# Patient Record
Sex: Female | Born: 1990 | Race: Black or African American | Hispanic: No | Marital: Single | Smoking: Former smoker
Health system: Southern US, Community
[De-identification: ages and names within clinical notes are randomized; demographics above are authoritative.]

## PROBLEM LIST (undated history)

## (undated) HISTORY — PX: CHOLECYSTECTOMY: SHX55

---

## 2014-08-12 ENCOUNTER — Encounter (HOSPITAL_BASED_OUTPATIENT_CLINIC_OR_DEPARTMENT_OTHER): Payer: Self-pay | Admitting: Emergency Medicine

## 2014-08-12 ENCOUNTER — Emergency Department (HOSPITAL_BASED_OUTPATIENT_CLINIC_OR_DEPARTMENT_OTHER)
Admission: EM | Admit: 2014-08-12 | Discharge: 2014-08-12 | Disposition: A | Discharge status: Home / Self Care | Payer: Medicaid Other | Attending: Emergency Medicine | Admitting: Emergency Medicine

## 2014-08-12 DIAGNOSIS — A5901 Trichomonal vulvovaginitis: Secondary | ICD-10-CM

## 2014-08-12 DIAGNOSIS — Z3A00 Weeks of gestation of pregnancy not specified: Secondary | ICD-10-CM | POA: Diagnosis not present

## 2014-08-12 DIAGNOSIS — O98319 Other infections with a predominantly sexual mode of transmission complicating pregnancy, unspecified trimester: Secondary | ICD-10-CM | POA: Diagnosis not present

## 2014-08-12 DIAGNOSIS — N390 Urinary tract infection, site not specified: Secondary | ICD-10-CM

## 2014-08-12 DIAGNOSIS — O2340 Unspecified infection of urinary tract in pregnancy, unspecified trimester: Secondary | ICD-10-CM | POA: Diagnosis not present

## 2014-08-12 DIAGNOSIS — O210 Mild hyperemesis gravidarum: Secondary | ICD-10-CM | POA: Insufficient documentation

## 2014-08-12 DIAGNOSIS — Z3490 Encounter for supervision of normal pregnancy, unspecified, unspecified trimester: Secondary | ICD-10-CM

## 2014-08-12 LAB — WET PREP, GENITAL: YEAST WET PREP: NONE SEEN

## 2014-08-12 LAB — URINALYSIS, ROUTINE W REFLEX MICROSCOPIC
BILIRUBIN URINE: NEGATIVE
Glucose, UA: NEGATIVE mg/dL
Hgb urine dipstick: NEGATIVE
KETONES UR: NEGATIVE mg/dL
NITRITE: NEGATIVE
PROTEIN: NEGATIVE mg/dL
Specific Gravity, Urine: 1.015 (ref 1.005–1.030)
UROBILINOGEN UA: 1 mg/dL (ref 0.0–1.0)
pH: 6 (ref 5.0–8.0)

## 2014-08-12 LAB — URINE MICROSCOPIC-ADD ON

## 2014-08-12 LAB — PREGNANCY, URINE: PREG TEST UR: POSITIVE — AB

## 2014-08-12 MED ORDER — CEPHALEXIN 500 MG PO CAPS: 500.0000 mg | ORAL_CAPSULE | Freq: Four times a day (QID) | ORAL | Status: DC

## 2014-08-12 MED ORDER — CEPHALEXIN 250 MG PO CAPS
500.0000 mg | ORAL_CAPSULE | Freq: Once | ORAL | Status: AC
  Administered 2014-08-12: 500 mg via ORAL
  Filled 2014-08-12: qty 2

## 2014-08-12 MED ORDER — CEFTRIAXONE SODIUM 1 G IJ SOLR: 1.0000 g | Freq: Once | INTRAMUSCULAR | Status: DC

## 2014-08-12 MED ORDER — METRONIDAZOLE 500 MG PO TABS: 500.0000 mg | ORAL_TABLET | Freq: Two times a day (BID) | ORAL | Status: DC

## 2014-08-12 MED ORDER — ONDANSETRON 4 MG PO TBDP
4.0000 mg | ORAL_TABLET | Freq: Once | ORAL | Status: AC
  Administered 2014-08-12: 4 mg via ORAL
  Filled 2014-08-12: qty 1

## 2014-08-12 NOTE — ED Notes (Signed)
Pt states she woke up this am and vomited blood.  Pt was diaphoresis.  Pt continues to have nausea.

## 2014-08-12 NOTE — ED Provider Notes (Signed)
CSN: [id number]     Arrival date & time 08/12/14  [id number] History   First MD Initiated Contact with Patient 08/12/14 0754     Chief Complaint  Patient presents with  . Emesis     (Consider location/radiation/quality/duration/timing/severity/associated sxs/prior Treatment) HPI Comments: 23 year old female, history of cholecystectomy 4 years ago who presents with a single episode of emesis this morning. She states that last night was a normal night, no nausea vomiting diarrhea abdominal pain fevers chills coughing or shortness of breath. This morning she woke up nauseated, vomited one time, states that her emesis contained blood, still has mild nausea but no pain. She denies any dysuria, denies being pregnant, denies any other chronic medications either over-the-counter or prescription. This is a new symptom, she does not have frequent nausea, she denies any pain at this time. Has had no medications prior to arrival. She does note being diaphoretic when she was vomiting.  Patient is a 23 y.o. female presenting with vomiting. The history is provided by the patient.  Emesis   No past medical history on file. Past Surgical History  Procedure Laterality Date  . Cholecystectomy     No family history on file. History  Substance Use Topics  . Smoking status: Never Smoker   . Smokeless tobacco: Not on file  . Alcohol Use: Not on file   OB History    No data available     Review of Systems  Gastrointestinal: Positive for vomiting.  All other systems reviewed and are negative.     Allergies  Depo-medrol  Home Medications   Prior to Admission medications   Medication Sig Start Date End Date Taking? Authorizing Provider  cephALEXin (KEFLEX) 500 MG capsule Take 1 capsule (500 mg total) by mouth 4 (four) times daily. 08/12/14   Vida RollerBrian D Vyom Brass, MD  metroNIDAZOLE (FLAGYL) 500 MG tablet Take 1 tablet (500 mg total) by mouth 2 (two) times daily. 08/12/14   Vida RollerBrian D Sander Remedios, MD   BP 109/61 mmHg   Pulse 70  Temp(Src) 98.4 F (36.9 C) (Oral)  Resp 16  Ht 5' (1.524 m)  Wt 120 lb (54.432 kg)  BMI 23.44 kg/m2  SpO2 97%  LMP 07/20/2014 Physical Exam  Constitutional: She appears well-developed and well-nourished. No distress.  HENT:  Head: Normocephalic and atraumatic.  Mouth/Throat: Oropharynx is clear and moist. No oropharyngeal exudate.  Oropharynx is clear and moist, no bleeding, no signs of nosebleed, no pharyngeal irritation, moist mucous membranes.  Eyes: Conjunctivae and EOM are normal. Pupils are equal, round, and reactive to light. Right eye exhibits no discharge. Left eye exhibits no discharge. No scleral icterus.  Neck: Normal range of motion. Neck supple. No JVD present. No thyromegaly present.  Cardiovascular: Normal rate, regular rhythm, normal heart sounds and intact distal pulses.  Exam reveals no gallop and no friction rub.   No murmur heard. Pulmonary/Chest: Effort normal and breath sounds normal. No respiratory distress. She has no wheezes. She has no rales.  Abdominal: Soft. Bowel sounds are normal. She exhibits no distension and no mass. There is no tenderness.  The abdomen is very soft and nontender, there is no guarding, no peritoneal signs, no epigastric tenderness, normal bowel sounds  Musculoskeletal: Normal range of motion. She exhibits no edema or tenderness.  Lymphadenopathy:    She has no cervical adenopathy.  Neurological: She is alert. Coordination normal.  Skin: Skin is warm and dry. No rash noted. No erythema.  Psychiatric: She has a normal mood and affect.  Her behavior is normal.  Nursing note and vitals reviewed.   ED Course  Procedures (including critical care time) Labs Review Labs Reviewed  WET PREP, GENITAL - Abnormal; Notable for the following:    Trich, Wet Prep MODERATE (*)    Clue Cells Wet Prep HPF POC MANY (*)    WBC, Wet Prep HPF POC FEW (*)    All other components within normal limits  PREGNANCY, URINE - Abnormal; Notable for  the following:    Preg Test, Ur POSITIVE (*)    All other components within normal limits  URINALYSIS, ROUTINE W REFLEX MICROSCOPIC - Abnormal; Notable for the following:    APPearance CLOUDY (*)    Leukocytes, UA MODERATE (*)    All other components within normal limits  URINE MICROSCOPIC-ADD ON - Abnormal; Notable for the following:    Squamous Epithelial / LPF MANY (*)    Bacteria, UA MANY (*)    All other components within normal limits  GC/CHLAMYDIA PROBE AMP  URINE CULTURE    Imaging Review No results found.    MDM   Final diagnoses:  UTI (lower urinary tract infection)  Pregnancy  Trichomonal vaginitis    The patient is well-appearing with normal vital signs and no evidence of ongoing vomiting or bleeding. Check urine, urine pregnancy, Zofran. Without any abdominal tenderness I would doubt pancreatitis, she has no infectious symptoms, patient in agreement with the plan.  The patient is a positive pregnancy test, Urinalysis reveals urinary tract infection with many bacteria, 11-20 white blood cells. The urinalysis also shows Trichomonas. Chaperoned pelvic exam shows moderate amount of white to yellow discharge, no cervical motion tenderness or adnexal tenderness or masses, no bleeding. Patient informed of results, we'll need to follow-up with OB/GYN, antibiotic started.  Trichomonas confirmed on wet prep, patient informed of follow-up STD testing, she will be given a phone call if positive for gonorrhea or chlamydia, she has been informed that she needs to tell her sexual partners, she is in agreement.  Meds given in ED:  Medications  ondansetron (ZOFRAN-ODT) disintegrating tablet 4 mg (4 mg Oral Given 08/12/14 0851)  cephALEXin (KEFLEX) capsule 500 mg (500 mg Oral Given 08/12/14 0850)    New Prescriptions   CEPHALEXIN (KEFLEX) 500 MG CAPSULE    Take 1 capsule (500 mg total) by mouth 4 (four) times daily.   METRONIDAZOLE (FLAGYL) 500 MG TABLET    Take 1 tablet (500 mg  total) by mouth 2 (two) times daily.       Vida RollerBrian D Naraly Fritcher, MD 08/12/14 [phone]

## 2014-08-12 NOTE — Discharge Instructions (Signed)
Please call your doctor for a followup appointment within 24-48 hours. When you talk to your doctor please let them know that you were seen in the emergency department and have them acquire all of your records so that they can discuss the findings with you and formulate a treatment plan to fully care for your new and ongoing problems. ° °

## 2014-08-13 LAB — URINE CULTURE: Colony Count: 50000

## 2014-08-13 LAB — GC/CHLAMYDIA PROBE AMP
CT PROBE, AMP APTIMA: NEGATIVE
GC PROBE AMP APTIMA: NEGATIVE

## 2015-04-11 ENCOUNTER — Encounter (HOSPITAL_COMMUNITY): Payer: Self-pay

## 2015-04-11 ENCOUNTER — Emergency Department (HOSPITAL_COMMUNITY)
Admission: EM | Admit: 2015-04-11 | Discharge: 2015-04-12 | Discharge status: Against Medical Advice | Payer: Medicaid Other | Attending: Emergency Medicine | Admitting: Emergency Medicine

## 2015-04-11 DIAGNOSIS — R110 Nausea: Secondary | ICD-10-CM | POA: Diagnosis not present

## 2015-04-11 DIAGNOSIS — R197 Diarrhea, unspecified: Secondary | ICD-10-CM | POA: Insufficient documentation

## 2015-04-11 NOTE — ED Provider Notes (Signed)
Patient not in room when I went to go see her. Apparently left after triage prior to being bedded. I did not examine this patient.   Pricilla Loveless, MD 04/11/15 2240

## 2015-04-11 NOTE — ED Notes (Signed)
Pt called for room placement multiple time with no answer

## 2015-04-11 NOTE — ED Notes (Signed)
Pt reports onset 2 days ago diarrhea and nausea.  Loose stools x 4 today.

## 2015-04-12 ENCOUNTER — Encounter (HOSPITAL_BASED_OUTPATIENT_CLINIC_OR_DEPARTMENT_OTHER): Payer: Self-pay | Admitting: Emergency Medicine

## 2017-11-02 ENCOUNTER — Other Ambulatory Visit: Payer: Self-pay

## 2017-11-02 ENCOUNTER — Encounter (HOSPITAL_COMMUNITY): Payer: Self-pay | Admitting: *Deleted

## 2017-11-02 ENCOUNTER — Emergency Department (HOSPITAL_COMMUNITY)
Admission: EM | Admit: 2017-11-02 | Discharge: 2017-11-02 | Disposition: A | Discharge status: Home / Self Care | Payer: Medicaid Other | Attending: Emergency Medicine | Admitting: Emergency Medicine

## 2017-11-02 DIAGNOSIS — R197 Diarrhea, unspecified: Secondary | ICD-10-CM

## 2017-11-02 DIAGNOSIS — O219 Vomiting of pregnancy, unspecified: Secondary | ICD-10-CM | POA: Diagnosis present

## 2017-11-02 DIAGNOSIS — Z3A00 Weeks of gestation of pregnancy not specified: Secondary | ICD-10-CM | POA: Diagnosis not present

## 2017-11-02 DIAGNOSIS — Z87891 Personal history of nicotine dependence: Secondary | ICD-10-CM | POA: Diagnosis not present

## 2017-11-02 DIAGNOSIS — R112 Nausea with vomiting, unspecified: Secondary | ICD-10-CM

## 2017-11-02 DIAGNOSIS — Z3A01 Less than 8 weeks gestation of pregnancy: Secondary | ICD-10-CM

## 2017-11-02 DIAGNOSIS — R531 Weakness: Secondary | ICD-10-CM

## 2017-11-02 LAB — CBC
HCT: 33.8 % — ABNORMAL LOW (ref 36.0–46.0)
Hemoglobin: 12.2 g/dL (ref 12.0–15.0)
MCH: 32 pg (ref 26.0–34.0)
MCHC: 36.1 g/dL — AB (ref 30.0–36.0)
MCV: 88.7 fL (ref 78.0–100.0)
Platelets: 267 10*3/uL (ref 150–400)
RBC: 3.81 MIL/uL — ABNORMAL LOW (ref 3.87–5.11)
RDW: 11.8 % (ref 11.5–15.5)
WBC: 8.5 10*3/uL (ref 4.0–10.5)

## 2017-11-02 LAB — COMPREHENSIVE METABOLIC PANEL
ALBUMIN: 4 g/dL (ref 3.5–5.0)
ALT: 20 U/L (ref 14–54)
AST: 20 U/L (ref 15–41)
Alkaline Phosphatase: 53 U/L (ref 38–126)
Anion gap: 10 (ref 5–15)
BILIRUBIN TOTAL: 1.4 mg/dL — AB (ref 0.3–1.2)
CALCIUM: 9.2 mg/dL (ref 8.9–10.3)
CO2: 23 mmol/L (ref 22–32)
Chloride: 102 mmol/L (ref 101–111)
Creatinine, Ser: 0.49 mg/dL (ref 0.44–1.00)
GFR calc Af Amer: >60 mL/min (ref >60)
GFR calc non Af Amer: >60 mL/min (ref >60)
GLUCOSE: 99 mg/dL (ref 65–99)
Potassium: 3.1 mmol/L — ABNORMAL LOW (ref 3.5–5.1)
Sodium: 135 mmol/L (ref 135–145)
TOTAL PROTEIN: 7 g/dL (ref 6.5–8.1)

## 2017-11-02 LAB — I-STAT BETA HCG BLOOD, ED (MC, WL, AP ONLY): I-stat hCG, quantitative: >2000 m[IU]/mL — ABNORMAL HIGH (ref <5)

## 2017-11-02 MED ORDER — PRENATAL VITAMIN 27-0.8 MG PO TABS: 1.0000 | ORAL_TABLET | Freq: Every day | ORAL | 0 refills | Status: AC

## 2017-11-02 MED ORDER — PYRIDOXINE HCL 25 MG PO TABS: 25.0000 mg | ORAL_TABLET | Freq: Four times a day (QID) | ORAL | 0 refills | Status: AC | PRN

## 2017-11-02 MED ORDER — ONDANSETRON HCL 4 MG/2ML IJ SOLN
4.0000 mg | Freq: Once | INTRAMUSCULAR | Status: AC
  Administered 2017-11-02: 4 mg via INTRAVENOUS
  Filled 2017-11-02: qty 2

## 2017-11-02 MED ORDER — POTASSIUM CHLORIDE CRYS ER 20 MEQ PO TBCR
40.0000 meq | EXTENDED_RELEASE_TABLET | Freq: Once | ORAL | Status: AC
  Administered 2017-11-02: 40 meq via ORAL
  Filled 2017-11-02: qty 2

## 2017-11-02 MED ORDER — SODIUM CHLORIDE 0.9 % IV BOLUS (SEPSIS)
1000.0000 mL | Freq: Once | INTRAVENOUS | Status: AC
  Administered 2017-11-02: 1000 mL via INTRAVENOUS

## 2017-11-02 MED ORDER — PYRIDOXINE HCL 100 MG/ML IJ SOLN
100.0000 mg | Freq: Once | INTRAMUSCULAR | Status: AC
  Administered 2017-11-02: 100 mg via INTRAVENOUS
  Filled 2017-11-02: qty 1

## 2017-11-02 NOTE — Discharge Instructions (Signed)
Your evaluation today is reassuring, nausea and vomiting likely related to new pregnancy, please take vitamin B6 up to 4 times a day as needed for nausea and vomiting, try and eat small frequent meals throughout the day and stay well-hydrated with water.  Please begin taking daily prenatal vitamin.  Please follow-up with the health department for continued OB/GYN care.  If you develop abdominal pain, vaginal bleeding or discharge, worsening nausea vomiting and you are unable to keep down fluids or other new or concerning symptoms please return to the ED for reevaluation.

## 2017-11-02 NOTE — ED Provider Notes (Signed)
[facility] Provider Note   CSN: [id number] Arrival date & time: 11/02/17  0754     History   Chief Complaint Chief Complaint  Patient presents with  . Weakness  . Emesis    HPI  Maria Hebert is a 27 y.o. Female 3 of prior cholecystectomy, otherwise healthy, presents to the ED for evaluation of nausea and vomiting with a few episodes of diarrhea, as well as feeling generally weak and fatigued for the past 4-5 days.  Patient reports since onset symptoms have been constant and not improving, has not tried any medications to treat her symptoms, has had no appetite denies any other aggravating or alleviating factors.  Reports some associated mild epigastric pain no lower abdominal pain, no hematemesis, hematochezia or melena.  Patient denies any fevers or chills, dysuria, frequency, hematuria or flank pain, no vaginal discharge, vaginal bleeding or pelvic pain.  Patient reports she may be pregnant last menstrual period was 1/19, and her periods in general have been very irregular.  She reports one prior pregnancy 6 years ago and she did not determine she was pregnant until she was 5 months along.  Patient denies any chest pain, shortness of breath, cough or upper respiratory symptoms, no dizziness or lightheadedness.      History reviewed. No pertinent past medical history.  There are no active problems to display for this patient.   Past Surgical History:  Procedure Laterality Date  . CHOLECYSTECTOMY      OB History    Gravida Para Term Preterm AB Living   0 0 0 0 0     SAB TAB Ectopic Multiple Live Births   0 0 0           Home Medications    Prior to Admission medications   Medication Sig Start Date End Date Taking? Authorizing Provider  cephALEXin (KEFLEX) 500 MG capsule Take 1 capsule (500 mg total) by mouth 4 (four) times daily. 08/12/14   Eber HongMiller, Brian, MD  metroNIDAZOLE (FLAGYL) 500 MG tablet Take 1 tablet (500 mg total)  by mouth 2 (two) times daily. 08/12/14   Eber HongMiller, Brian, MD    Family History History reviewed. No pertinent family history.  Social History Social History   Tobacco Use  . Smoking status: Former Smoker  Substance Use Topics  . Alcohol use: Yes    Comment: occ wine   . Drug use: No     Allergies   Depo-medrol [methylprednisolone] and Depo-provera [medroxyprogesterone acetate]   Review of Systems Review of Systems  Constitutional: Positive for fatigue. Negative for chills and fever.  HENT: Negative for congestion and sore throat.   Eyes: Negative for visual disturbance.  Respiratory: Negative for cough, chest tightness and shortness of breath.   Cardiovascular: Negative for chest pain and leg swelling.  Gastrointestinal: Positive for abdominal pain, diarrhea, nausea and vomiting. Negative for blood in stool and constipation.  Genitourinary: Negative for dysuria, flank pain, frequency, hematuria, urgency, vaginal bleeding, vaginal discharge and vaginal pain.  Musculoskeletal: Negative for arthralgias and myalgias.  Skin: Negative for color change, pallor and rash.  Neurological: Positive for weakness. Negative for dizziness, light-headedness and headaches.     Physical Exam Updated Vital Signs BP 117/67 (BP Location: Right Arm)   Pulse 61   Temp 98.6 F (37 C) (Oral)   Resp 12   LMP 09/22/2017   SpO2 100%   Physical Exam  Constitutional: She is oriented to person, place, and time. She  appears well-developed and well-nourished. No distress.  HENT:  Head: Normocephalic and atraumatic.  Mouth/Throat: Oropharynx is clear and moist.  Eyes: Right eye exhibits no discharge. Left eye exhibits no discharge.  Neck: Neck supple.  Cardiovascular: Normal rate, regular rhythm, normal heart sounds and intact distal pulses.  Pulmonary/Chest: Effort normal and breath sounds normal. No stridor. No respiratory distress. She has no wheezes. She has no rales.  Respirations equal and  unlabored, patient able to speak in full sentences, lungs clear to auscultation bilaterally  Abdominal: Soft. Bowel sounds are normal. She exhibits no distension and no mass. There is no tenderness. There is no guarding.  Abdomen flat, soft, very minimal epigastric tenderness without guarding, all other quadrants nontender to palpation specifically there is no lower abdominal tenderness over the pelvis, no CVA tenderness, no peritoneal signs  Musculoskeletal: She exhibits no edema or deformity.  Neurological: She is alert and oriented to person, place, and time. Coordination normal.  Skin: Skin is warm and dry. Capillary refill takes less than 2 seconds. She is not diaphoretic.  Psychiatric: She has a normal mood and affect. Her behavior is normal.  Nursing note and vitals reviewed.    ED Treatments / Results  Labs (all labs ordered are listed, but only abnormal results are displayed) Labs Reviewed  COMPREHENSIVE METABOLIC PANEL - Abnormal; Notable for the following components:      Result Value   Potassium 3.1 (*)    BUN <5 (*)    Total Bilirubin 1.4 (*)    All other components within normal limits  CBC - Abnormal; Notable for the following components:   RBC 3.81 (*)    HCT 33.8 (*)    MCHC 36.1 (*)    All other components within normal limits  I-STAT BETA HCG BLOOD, ED ([facility], [facility], [facility] ONLY) - Abnormal; Notable for the following components:   I-stat hCG, quantitative >2,000.0 (*)    All other components within normal limits    EKG  EKG Interpretation None       Radiology No results found.  Procedures Procedures (including critical care time)  Medications Ordered in ED Medications  sodium chloride 0.9 % bolus 1,000 mL (0 mLs Intravenous Stopped 11/02/17 1242)  potassium chloride SA (K-DUR,KLOR-CON) CR tablet 40 mEq (40 mEq Oral Given 11/02/17 1120)  pyridOXINE (B-6) injection 100 mg (100 mg Intravenous Given 11/02/17 1241)  ondansetron (ZOFRAN) injection 4 mg (4 mg Intravenous  Given 11/02/17 1123)     Initial Impression / Assessment and Plan / ED Course  I have reviewed the triage vital signs and the nursing notes.  Pertinent labs & imaging results that were available during my care of the patient were reviewed by me and considered in my medical decision making (see chart for details).  Patient presents to the ED for evaluation of 4-5 days of weakness, fatigue, nausea and vomiting, reports she has not been able to get good rest.  On exam vitals are normal and patient is overall well-appearing.  I-STAT beta hCG done in triage is greater than 2000, patient reports last menstrual period was 1/19 but her periods are extremely irregular, this pregnancy was not planned, is not her first pregnancy, has a 80-year-old at home.  Abdominal exam is benign, given that patient is not complaining of any vaginal discharge, vaginal bleeding or pelvic discomfort, she prefers to defer pelvic exam today, she plans to follow-up with the health department.  Will give fluids, pyridoxine and Zofran.  On reevaluation patient reports  she is feeling much better has not had no further episodes of nausea vomiting or diarrhea and feels like she has more energy than she has had all week.  Abdomen remains benign.  At this time patient is stable for discharge home we will have her begin taking daily prenatal vitamin and vitamin B6 to help with nausea, discussed with patient will hold off on prescribing her Zofran at this time, patient to follow-up with the health department for continued OB care, strict return precautions regarding abdominal pain, vaginal bleeding or discharge, and worsening nausea vomiting resulting in dehydration discussed with the patient.  She expresses understanding and is in agreement with plan.  Final Clinical Impressions(s) / ED Diagnoses   Final diagnoses:  Nausea vomiting and diarrhea  Weakness  Less than [redacted] weeks gestation of pregnancy    ED Discharge Orders        Ordered     Prenatal Vit-Fe Fumarate-FA (PRENATAL VITAMIN) 27-0.8 MG TABS  Daily     11/02/17 1342    pyridOXINE (VITAMIN B-6) 25 MG tablet  Every 6 hours PRN     11/02/17 1342       Dartha Lodge, PA-C 11/02/17 1400    Bethann Berkshire, MD 11/02/17 1546

## 2017-11-02 NOTE — ED Triage Notes (Signed)
Pt reports fatigue, weakness, n/v and insomnia x 1 week. Pt states she is possibly pregnant. lmp 1/19.

## 2019-10-13 ENCOUNTER — Emergency Department (HOSPITAL_COMMUNITY): Payer: 59

## 2019-10-13 ENCOUNTER — Emergency Department (HOSPITAL_COMMUNITY)
Admission: EM | Admit: 2019-10-13 | Discharge: 2019-10-13 | Disposition: A | Discharge status: Home / Self Care | Payer: 59 | Attending: Emergency Medicine | Admitting: Emergency Medicine

## 2019-10-13 DIAGNOSIS — R109 Unspecified abdominal pain: Secondary | ICD-10-CM | POA: Diagnosis present

## 2019-10-13 DIAGNOSIS — Z87891 Personal history of nicotine dependence: Secondary | ICD-10-CM | POA: Insufficient documentation

## 2019-10-13 LAB — CBC WITH DIFFERENTIAL/PLATELET
Abs Immature Granulocytes: 0.02 10*3/uL (ref 0.00–0.07)
Basophils Absolute: 0 10*3/uL (ref 0.0–0.1)
Basophils Relative: 1 %
Eosinophils Absolute: 0.2 10*3/uL (ref 0.0–0.5)
Eosinophils Relative: 3 %
HCT: 35.6 % — ABNORMAL LOW (ref 36.0–46.0)
Hemoglobin: 12 g/dL (ref 12.0–15.0)
Immature Granulocytes: 0 %
Lymphocytes Relative: 26 %
Lymphs Abs: 1.9 10*3/uL (ref 0.7–4.0)
MCH: 30.8 pg (ref 26.0–34.0)
MCHC: 33.7 g/dL (ref 30.0–36.0)
MCV: 91.5 fL (ref 80.0–100.0)
Monocytes Absolute: 0.4 10*3/uL (ref 0.1–1.0)
Monocytes Relative: 6 %
Neutro Abs: 4.6 10*3/uL (ref 1.7–7.7)
Neutrophils Relative %: 64 %
Platelets: 257 10*3/uL (ref 150–400)
RBC: 3.89 MIL/uL (ref 3.87–5.11)
RDW: 11.8 % (ref 11.5–15.5)
WBC: 7.2 10*3/uL (ref 4.0–10.5)
nRBC: 0 % (ref 0.0–0.2)

## 2019-10-13 LAB — URINALYSIS, ROUTINE W REFLEX MICROSCOPIC
Bilirubin Urine: NEGATIVE
Glucose, UA: NEGATIVE mg/dL
Hgb urine dipstick: NEGATIVE
Ketones, ur: NEGATIVE mg/dL
Nitrite: NEGATIVE
Protein, ur: NEGATIVE mg/dL
Specific Gravity, Urine: 1.014 (ref 1.005–1.030)
pH: 6 (ref 5.0–8.0)

## 2019-10-13 LAB — COMPREHENSIVE METABOLIC PANEL
ALT: 14 U/L (ref 0–44)
AST: 16 U/L (ref 15–41)
Albumin: 4 g/dL (ref 3.5–5.0)
Alkaline Phosphatase: 69 U/L (ref 38–126)
Anion gap: 9 (ref 5–15)
BUN: 8 mg/dL (ref 6–20)
CO2: 25 mmol/L (ref 22–32)
Calcium: 9.3 mg/dL (ref 8.9–10.3)
Chloride: 104 mmol/L (ref 98–111)
Creatinine, Ser: 0.6 mg/dL (ref 0.44–1.00)
GFR calc Af Amer: >60 mL/min (ref >60)
GFR calc non Af Amer: >60 mL/min (ref >60)
Glucose, Bld: 96 mg/dL (ref 70–99)
Potassium: 3.7 mmol/L (ref 3.5–5.1)
Sodium: 138 mmol/L (ref 135–145)
Total Bilirubin: 0.6 mg/dL (ref 0.3–1.2)
Total Protein: 7 g/dL (ref 6.5–8.1)

## 2019-10-13 LAB — I-STAT BETA HCG BLOOD, ED (MC, WL, AP ONLY): I-stat hCG, quantitative: <5 m[IU]/mL (ref <5)

## 2019-10-13 LAB — LIPASE, BLOOD: Lipase: 26 U/L (ref 11–51)

## 2019-10-13 MED ORDER — KETOROLAC TROMETHAMINE 15 MG/ML IJ SOLN
15.0000 mg | Freq: Once | INTRAMUSCULAR | Status: AC
  Administered 2019-10-13: 15 mg via INTRAVENOUS
  Filled 2019-10-13: qty 1

## 2019-10-13 MED ORDER — IOHEXOL 300 MG/ML  SOLN
100.0000 mL | Freq: Once | INTRAMUSCULAR | Status: AC | PRN
  Administered 2019-10-13: 100 mL via INTRAVENOUS

## 2019-10-13 NOTE — ED Notes (Signed)
ED Provider at bedside. 

## 2019-10-13 NOTE — ED Triage Notes (Signed)
Pt endorses abd pain for 2 weeks. States the pain is on both sides.

## 2019-10-13 NOTE — ED Triage Notes (Signed)
PT is here with reports right mid-lower quad abdominal pain X 2wks. Initially started on her mid left side of her abdomin. She reports that her menstrual period started about th same time, she assumed it was premenstrual pain; but period her monthly period is completed and the pain persist. She reports using heating pads without results  Intermittent pain Gallbladder removed 10 years ago

## 2019-10-13 NOTE — Discharge Instructions (Addendum)
Please return for any problem.  Follow-up with your regular care provider as instructed. °

## 2019-10-13 NOTE — ED Notes (Signed)
The RN will get the labs for the Pt.

## 2019-10-13 NOTE — ED Provider Notes (Signed)
[facility] EMERGENCY DEPARTMENT Provider Note   CSN: [id number] Arrival date & time: 10/13/19  3151     History Chief Complaint  Patient presents with  . Abdominal Pain    Maria Hebert is a 29 y.o. female.  29 year old female with prior medical history as detailed below presents for evaluation of right sided abdominal pain.  Patient reports onset of pain approximately 2 weeks prior.  Pain was intermittent.  She reports that the pain began on the left side and over the last week has moved to the right side.  Patient does report that she is status post cholecystectomy 10 years prior.  She reports that she has not experienced nausea, vomiting, fever, bowel movement change, urinary symptoms, or other specific complaint.  She took ibuprofen at home for his symptoms with moderate improvement of her pain.    The history is provided by the patient and medical records.  Abdominal Pain Pain location:  RUQ and RLQ Pain quality: aching   Pain radiates to:  Does not radiate Pain severity:  Mild Onset quality:  Gradual Duration:  2 weeks Timing:  Constant Progression:  Waxing and waning Chronicity:  New Relieved by:  Nothing Worsened by:  Nothing Ineffective treatments:  NSAIDs      No past medical history on file.  There are no problems to display for this patient.   Past Surgical History:  Procedure Laterality Date  . CHOLECYSTECTOMY       OB History    Gravida  0   Para  0   Term  0   Preterm  0   AB  0   Living        SAB  0   TAB  0   Ectopic  0   Multiple      Live Births              No family history on file.  Social History   Tobacco Use  . Smoking status: Former Smoker  Substance Use Topics  . Alcohol use: Yes    Comment: occ wine   . Drug use: No    Home Medications Prior to Admission medications   Medication Sig Start Date End Date Taking? Authorizing Provider  ibuprofen (ADVIL) 200 MG tablet Take 200 mg  by mouth every 6 (six) hours as needed for moderate pain.    Yes [provider]  Prenatal Vit-Fe Fumarate-FA (PRENATAL VITAMIN) 27-0.8 MG TABS Take 1 tablet by mouth daily. Patient not taking: Reported on 10/13/2019 11/02/17   Dartha Lodge, PA-C  pyridOXINE (VITAMIN B-6) 25 MG tablet Take 1 tablet (25 mg total) by mouth every 6 (six) hours as needed. Patient not taking: Reported on 10/13/2019 11/02/17   Dartha Lodge, PA-C    Allergies    Depo-medrol [methylprednisolone] and Depo-provera [medroxyprogesterone acetate]  Review of Systems   Review of Systems  Gastrointestinal: Positive for abdominal pain.  All other systems reviewed and are negative.   Physical Exam Updated Vital Signs BP 112/60   Pulse 71   Temp 98.2 F (36.8 C) (Oral)   Resp 16   SpO2 99%   Physical Exam Vitals and nursing note reviewed.  Constitutional:      General: She is not in acute distress.    Appearance: She is well-developed.  HENT:     Head: Normocephalic and atraumatic.  Eyes:     Conjunctiva/sclera: Conjunctivae normal.     Pupils: Pupils are equal,  round, and reactive to light.  Cardiovascular:     Rate and Rhythm: Normal rate and regular rhythm.     Heart sounds: Normal heart sounds.  Pulmonary:     Effort: Pulmonary effort is normal. No respiratory distress.     Breath sounds: Normal breath sounds.  Abdominal:     General: There is no distension.     Palpations: Abdomen is soft.     Tenderness: There is abdominal tenderness.     Comments: Mild diffuse tenderness overlying the right abdomen.  No CVA tenderness   No lower abdominal tenderness with palpation.   Musculoskeletal:        General: No deformity. Normal range of motion.     Cervical back: Normal range of motion and neck supple.  Skin:    General: Skin is warm and dry.  Neurological:     Mental Status: She is alert and oriented to person, place, and time.     ED Results / Procedures / Treatments   Labs (all  labs ordered are listed, but only abnormal results are displayed) Labs Reviewed  CBC WITH DIFFERENTIAL/PLATELET - Abnormal; Notable for the following components:      Result Value   HCT 35.6 (*)    All other components within normal limits  URINALYSIS, ROUTINE W REFLEX MICROSCOPIC - Abnormal; Notable for the following components:   APPearance HAZY (*)    Leukocytes,Ua LARGE (*)    Bacteria, UA RARE (*)    All other components within normal limits  COMPREHENSIVE METABOLIC PANEL  LIPASE, BLOOD  I-STAT BETA HCG BLOOD, ED ([facility], [facility], [facility] ONLY)    EKG None  Radiology No results found.  Procedures Procedures (including critical care time)  Medications Ordered in ED Medications  ketorolac (TORADOL) 15 MG/ML injection 15 mg (15 mg Intravenous Given 10/13/19 1037)    ED Course  I have reviewed the triage vital signs and the nursing notes.  Pertinent labs & imaging results that were available during my care of the patient were reviewed by me and considered in my medical decision making (see chart for details).    MDM Rules/Calculators/A&P                      MDM  Screen complete  Maria Hebert was evaluated in Emergency Department on 10/13/2019 for the symptoms described in the history of present illness. She was evaluated in the context of the global COVID-19 pandemic, which necessitated consideration that the patient might be at risk for infection with the SARS-CoV-2 virus that causes COVID-19. Institutional protocols and algorithms that pertain to the evaluation of patients at risk for COVID-19 are in a state of rapid change based on information released by regulatory bodies including the CDC and federal and state organizations. These policies and algorithms were followed during the patient's care in the ED.  Patient is presenting for evaluation of abdominal pain.  Work-up and exam are not suggestive of significant acute pathology.  CT imaging was held up during the read -  verbally reported to me as being a normal appendix without other acute pathology.    Patient does feel improved following her ED evaluation.  She desires discharge.  She does understand need for close follow-up.  Strict return precautions given and understood.  Final Clinical Impression(s) / ED Diagnoses Final diagnoses:  Abdominal pain, unspecified abdominal location    Rx / DC Orders ED Discharge Orders    None  Valarie Merino, MD 10/13/19 1447

## 2019-10-13 NOTE — ED Notes (Signed)
Patient verbalizes understanding of discharge instructions. Opportunity for questioning and answers were provided. Armband removed by staff, pt discharged from ED.  

## 2020-08-04 IMAGING — CT CT ABD-PELV W/ CM
2 of 4 series · 16 of 46 positions shown, 18 images · IV contrast (omnipaque)
Comparison: None.

CLINICAL DATA: Right greater than left-sided abdominal pain for the
past 2 weeks.

EXAM:
CT ABDOMEN AND PELVIS WITH CONTRAST
TECHNIQUE: Multidetector CT imaging of the abdomen and pelvis was performed
using the standard protocol following bolus administration of
intravenous contrast.
CONTRAST:  100mL OMNIPAQUE IOHEXOL 300 MG/ML  SOLN

[Series 3: abdomen 5.0 · axial · 0.59mm/px · z∈[-360,+5]mm · 13 of 83 slices shown, 15 images]
[im 5/83  soft-tissue]
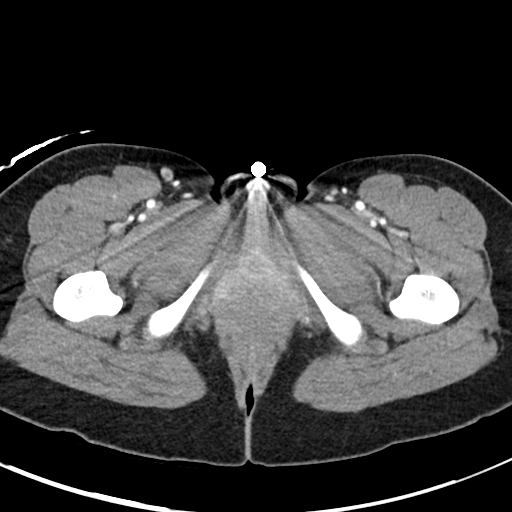
[im 5/83  bone]
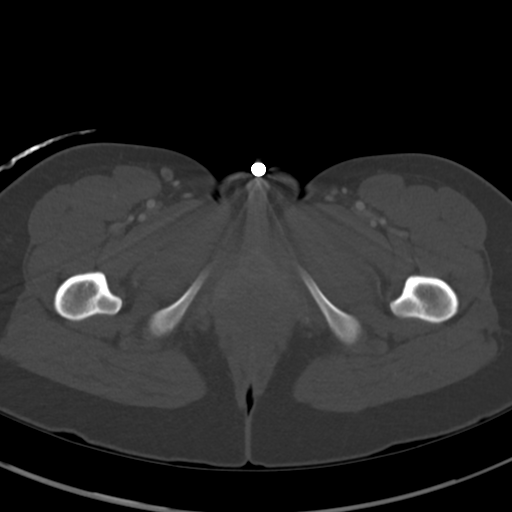
[im 10/83  soft-tissue]
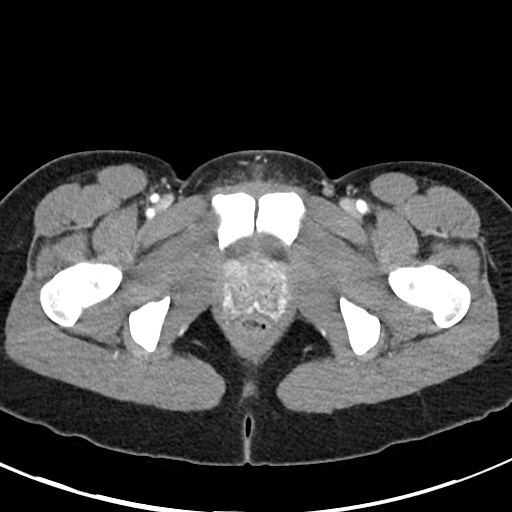
[im 19/83  soft-tissue]
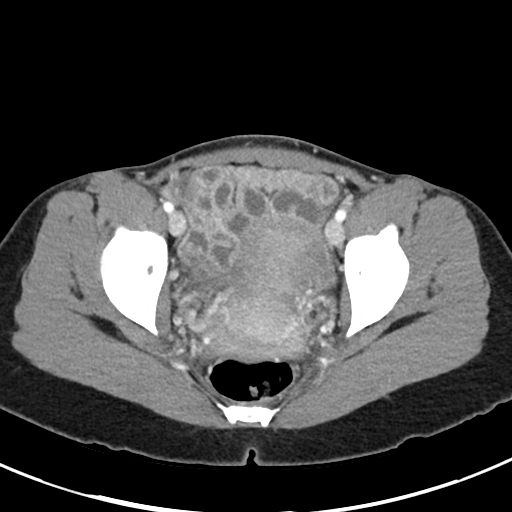
[im 23/83  soft-tissue]
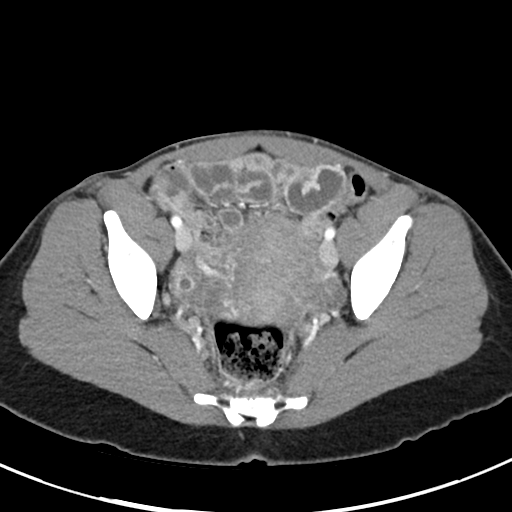
[im 28/83  soft-tissue]
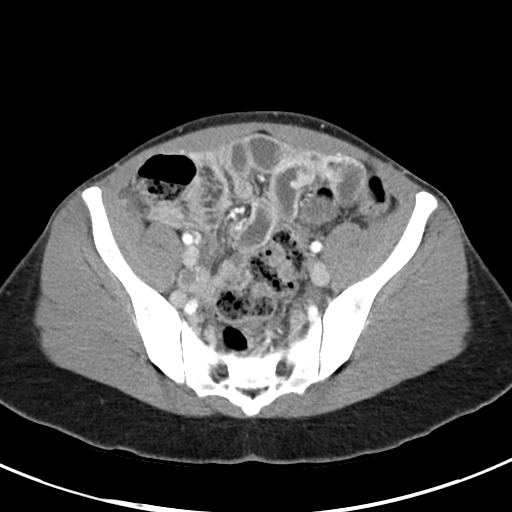
[im 37/83  soft-tissue]
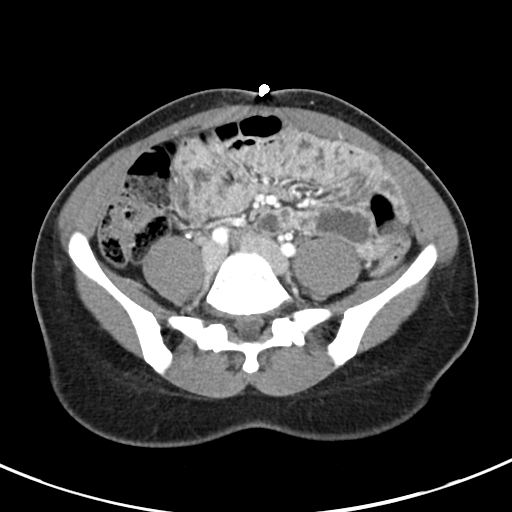
[im 42/83  soft-tissue]
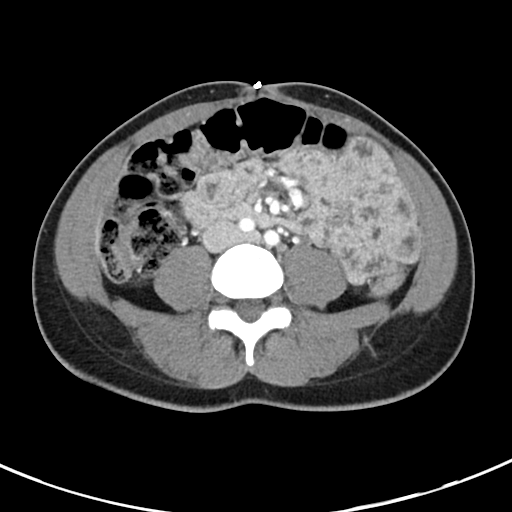
[im 46/83  soft-tissue]
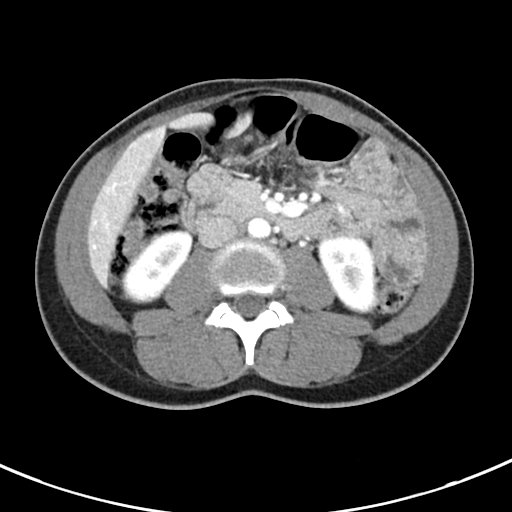
[im 55/83  soft-tissue]
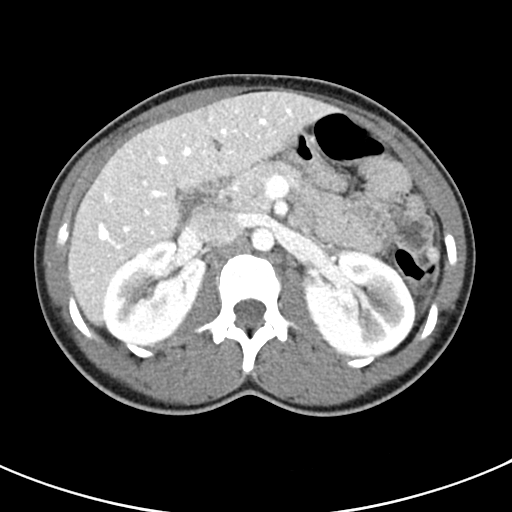
[im 55/83  bone]
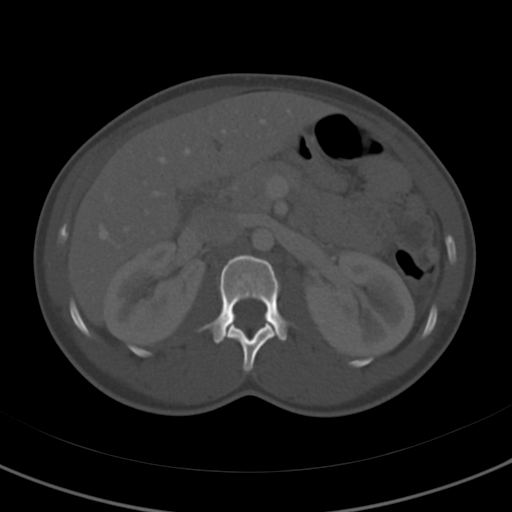
[im 60/83  soft-tissue]
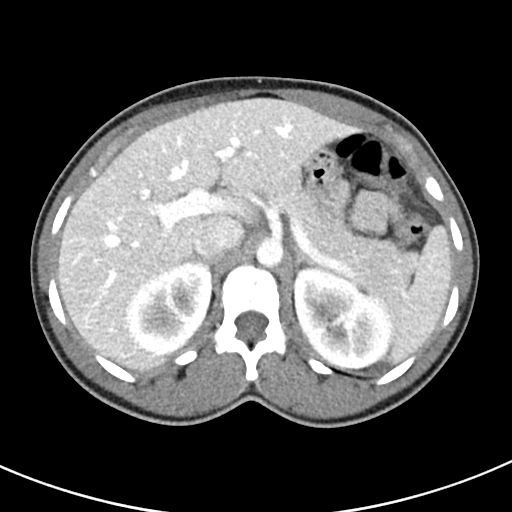
[im 64/83  soft-tissue]
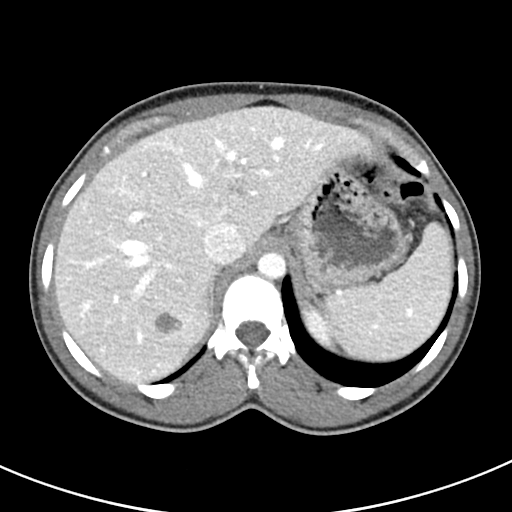
[im 73/83  soft-tissue]
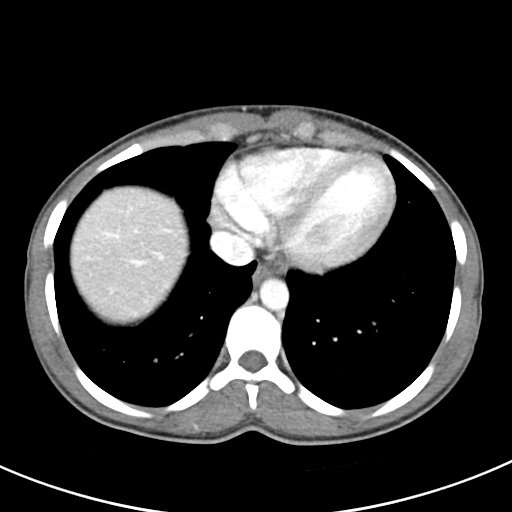
[im 78/83  soft-tissue]
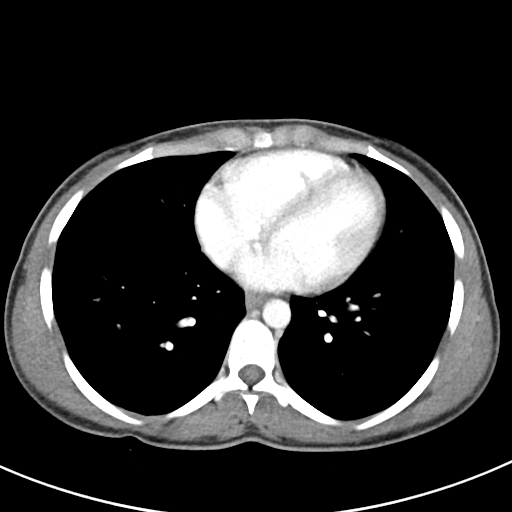

[Series 6: abdomen 3.0 mpr cor · coronal · 0.59mm/px · 3 of 81 slices shown]
[im 27/81  soft-tissue]
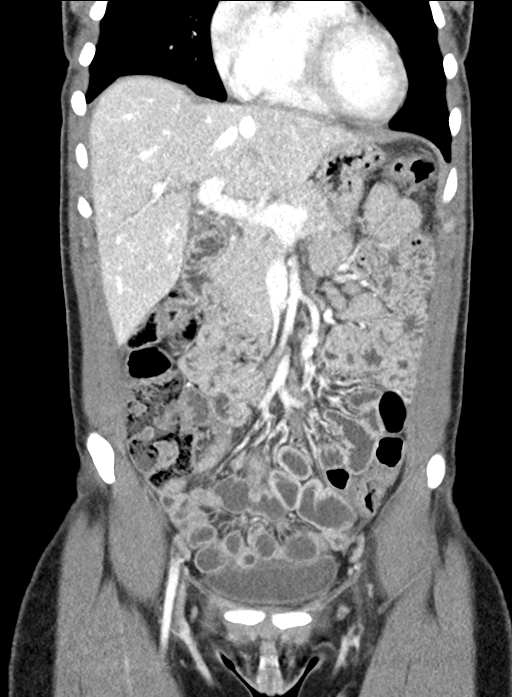
[im 36/81  soft-tissue]
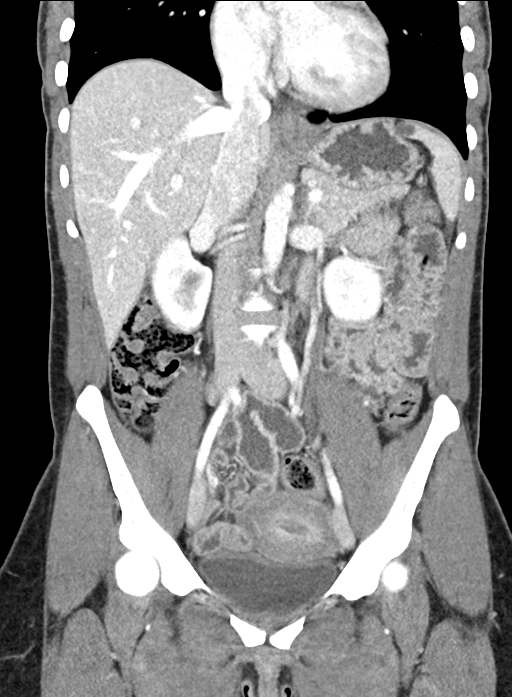
[im 45/81  soft-tissue]
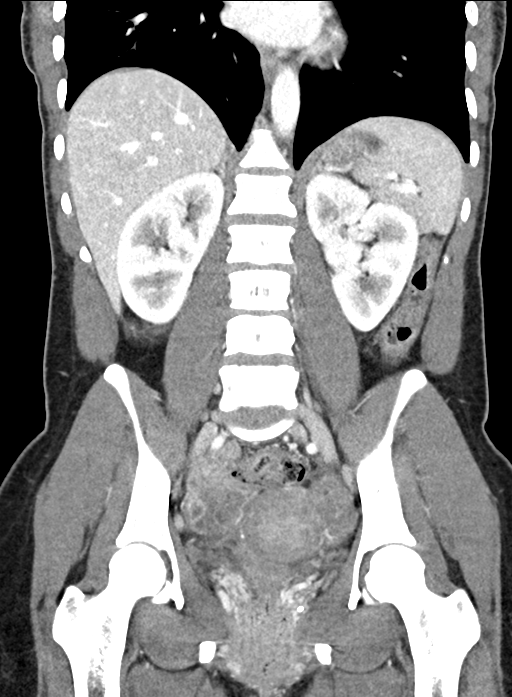

[16 of 46 positions shown; findings below may reference images not displayed]

FINDINGS: Lower chest: No acute abnormality.

Hepatobiliary: 1.5 cm lesion in the posterior right hepatic lobe
with subtle peripheral nodular enhancement, likely hemangioma.
Status post cholecystectomy. No biliary dilatation.

Pancreas: Unremarkable. No pancreatic ductal dilatation or
surrounding inflammatory changes.

Spleen: Normal in size without focal abnormality.

Adrenals/Urinary Tract: Adrenal glands are unremarkable. Kidneys are
normal, without renal calculi, focal lesion, or hydronephrosis.
Bladder is unremarkable.

Stomach/Bowel: Stomach is within normal limits. Appendix appears
normal. No evidence of bowel wall thickening, distention, or
inflammatory changes.

Vascular/Lymphatic: No significant vascular findings are present. No
enlarged abdominal or pelvic lymph nodes.

Reproductive: Uterus and bilateral adnexa are unremarkable. Right
corpus luteum.

Other: No abdominal wall hernia or abnormality. No abdominopelvic
ascites. No pneumoperitoneum.

Musculoskeletal: No acute or significant osseous findings.
IMPRESSION: 1. No acute intra-abdominal process.  Normal appendix.
2. 1.5 cm lesion in the posterior right hepatic lobe with subtle
peripheral nodular enhancement, likely a hemangioma.

## 2023-04-02 DIAGNOSIS — N898 Other specified noninflammatory disorders of vagina: Secondary | ICD-10-CM | POA: Diagnosis not present

## 2023-04-02 DIAGNOSIS — Z7251 High risk heterosexual behavior: Secondary | ICD-10-CM | POA: Diagnosis not present

## 2023-06-16 ENCOUNTER — Other Ambulatory Visit: Payer: Self-pay

## 2023-06-16 ENCOUNTER — Encounter (HOSPITAL_BASED_OUTPATIENT_CLINIC_OR_DEPARTMENT_OTHER): Payer: Self-pay

## 2023-06-16 ENCOUNTER — Emergency Department (HOSPITAL_BASED_OUTPATIENT_CLINIC_OR_DEPARTMENT_OTHER)
Admission: EM | Admit: 2023-06-16 | Discharge: 2023-06-16 | Disposition: A | Discharge status: Home / Self Care | Payer: Medicaid Other | Attending: Emergency Medicine | Admitting: Emergency Medicine

## 2023-06-16 ENCOUNTER — Emergency Department (HOSPITAL_BASED_OUTPATIENT_CLINIC_OR_DEPARTMENT_OTHER): Payer: Medicaid Other | Admitting: Radiology

## 2023-06-16 DIAGNOSIS — S3992XA Unspecified injury of lower back, initial encounter: Secondary | ICD-10-CM | POA: Diagnosis present

## 2023-06-16 DIAGNOSIS — S39012A Strain of muscle, fascia and tendon of lower back, initial encounter: Secondary | ICD-10-CM | POA: Insufficient documentation

## 2023-06-16 DIAGNOSIS — Y92410 Unspecified street and highway as the place of occurrence of the external cause: Secondary | ICD-10-CM | POA: Insufficient documentation

## 2023-06-16 DIAGNOSIS — Z041 Encounter for examination and observation following transport accident: Secondary | ICD-10-CM | POA: Diagnosis not present

## 2023-06-16 LAB — URINALYSIS, ROUTINE W REFLEX MICROSCOPIC
Bilirubin Urine: NEGATIVE
Glucose, UA: NEGATIVE mg/dL
Hgb urine dipstick: NEGATIVE
Ketones, ur: NEGATIVE mg/dL
Leukocytes,Ua: NEGATIVE
Nitrite: NEGATIVE
Protein, ur: NEGATIVE mg/dL
Specific Gravity, Urine: 1.012 (ref 1.005–1.030)
pH: 7 (ref 5.0–8.0)

## 2023-06-16 LAB — PREGNANCY, URINE: Preg Test, Ur: NEGATIVE

## 2023-06-16 NOTE — Discharge Instructions (Signed)
You are seen in the emergency department today for back pain.  Your labs and imaging are thankfully reassuring and I believe that you are likely dealing with a lumbar strain.  I would recommend taking Tylenol, ibuprofen or Aleve for your pain.  Plan on following up with your primary care provider unless your symptoms worsening.  In the case of symptoms worsening, return the emergency department.
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
